# Patient Record
Sex: Male | Born: 1970 | Race: White | Hispanic: No | Marital: Single | Smoking: Current every day smoker
Health system: Southern US, Community
[De-identification: ages and names within clinical notes are randomized; demographics above are authoritative.]

---

## 2018-08-09 ENCOUNTER — Other Ambulatory Visit: Payer: Self-pay

## 2018-08-09 ENCOUNTER — Ambulatory Visit (INDEPENDENT_AMBULATORY_CARE_PROVIDER_SITE_OTHER): Payer: Self-pay

## 2018-08-09 ENCOUNTER — Encounter (HOSPITAL_COMMUNITY): Payer: Self-pay

## 2018-08-09 ENCOUNTER — Ambulatory Visit (HOSPITAL_COMMUNITY)
Admission: EM | Admit: 2018-08-09 | Discharge: 2018-08-09 | Disposition: A | Discharge status: Home / Self Care | Payer: Self-pay | Attending: Internal Medicine | Admitting: Internal Medicine

## 2018-08-09 DIAGNOSIS — K5900 Constipation, unspecified: Secondary | ICD-10-CM

## 2018-08-09 MED ORDER — POLYETHYLENE GLYCOL 3350 17 GM/SCOOP PO POWD: 1.0000 | Freq: Once | ORAL | 0 refills | Status: AC

## 2018-08-09 NOTE — ED Triage Notes (Signed)
Pt cc abdominal pain. This started Monday but is getting better.

## 2018-08-09 NOTE — ED Provider Notes (Signed)
[facility]    CSN: [id number] Arrival date & time: 08/09/18  1227     History   Chief Complaint Chief Complaint  Patient presents with  . Abdominal Pain    HPI Christian Hooper is a 48 y.o. male.   Patient is a 48 year old male that presents with abdominal pain, bloating that is been constant since Monday.  Most of his pain is in the left upper quadrant area.  He has been taking Pepto-Bismol for his symptoms which does not help.  He has had a few episodes of diarrhea.  This morning he had a hard stool and his abdominal pain was slightly decreased after the stool.  He denies any blood in his stool.  He denies any new medicines, fevers, chills, myalgias.  He denies any recent traveling or sick contacts.  He reports a history of anxiety and is wondering this is related.  He does not drink alcohol.  ROS per HPI      History reviewed. No pertinent past medical history.  There are no active problems to display for this patient.   History reviewed. No pertinent surgical history.     Home Medications    Prior to Admission medications   Medication Sig Start Date End Date Taking? Authorizing Provider  polyethylene glycol powder (GLYCOLAX/MIRALAX) powder Take 255 g by mouth once for 1 dose. 08/09/18 08/09/18  Janace Aris, NP    Family History History reviewed. No pertinent family history.  Social History Social History   Tobacco Use  . Smoking status: Current Every Day Smoker  . Smokeless tobacco: Never Used  Substance Use Topics  . Alcohol use: Never    Frequency: Never  . Drug use: Never     Allergies   Patient has no known allergies.   Review of Systems Review of Systems   Physical Exam Triage Vital Signs ED Triage Vitals  Enc Vitals Group     BP 08/09/18 1337 120/87     Pulse Rate 08/09/18 1337 75     Resp 08/09/18 1337 18     Temp 08/09/18 1337 98.8 F (37.1 C)     Temp Source 08/09/18 1337 Tympanic     SpO2 08/09/18 1337 100 %   Weight 08/09/18 1339 180 lb (81.6 kg)     Height --      Head Circumference --      Peak Flow --      Pain Score 08/09/18 1337 3     Pain Loc --      Pain Edu? --      Excl. in GC? --    No data found.  Updated Vital Signs BP 120/87 (BP Location: Right Arm)   Pulse 75   Temp 98.8 F (37.1 C) (Tympanic)   Resp 18   Wt 180 lb (81.6 kg)   SpO2 100%   Visual Acuity Right Eye Distance:   Left Eye Distance:   Bilateral Distance:    Right Eye Near:   Left Eye Near:    Bilateral Near:     Physical Exam Vitals signs and nursing note reviewed.  Constitutional:      Appearance: He is well-developed.  HENT:     Head: Normocephalic and atraumatic.  Eyes:     Conjunctiva/sclera: Conjunctivae normal.  Neck:     Musculoskeletal: Neck supple.  Cardiovascular:     Rate and Rhythm: Normal rate and regular rhythm.     Heart sounds: No murmur.  Pulmonary:  Effort: Pulmonary effort is normal. No respiratory distress.     Breath sounds: Normal breath sounds.  Abdominal:     General: Bowel sounds are increased. There is distension. There is no abdominal bruit.     Palpations: Abdomen is soft.     Tenderness: There is abdominal tenderness in the left upper quadrant. There is no right CVA tenderness, left CVA tenderness, guarding or rebound. Negative signs include Murphy's sign, Rovsing's sign and McBurney's sign.     Hernia: No hernia is present.  Skin:    General: Skin is warm and dry.  Neurological:     Mental Status: He is alert.  Psychiatric:        Mood and Affect: Mood normal.      UC Treatments / Results  Labs (all labs ordered are listed, but only abnormal results are displayed) Labs Reviewed - No data to display  EKG None  Radiology Dg Abd 2 Views  Result Date: 08/09/2018 CLINICAL DATA:  48 year old male with a history of abdominal pain EXAM: ABDOMEN - 2 VIEW COMPARISON:  None. FINDINGS: The bowel gas pattern is normal. There is no evidence of free air. No  significant stool burden. No radio-opaque calculi or other significant radiographic abnormality is seen. Incompletely imaged surgical changes of the right hip IMPRESSION: Negative plain film abdomen Electronically Signed   By: Gilmer Mor D.O.   On: 08/09/2018 15:25    Procedures Procedures (including critical care time)  Medications Ordered in UC Medications - No data to display  Initial Impression / Assessment and Plan / UC Course  I have reviewed the triage vital signs and the nursing notes.  Pertinent labs & imaging results that were available during my care of the patient were reviewed by me and considered in my medical decision making (see chart for details).     X-ray revealed moderate stool in the left and right upper quadrant area. Most likely this is causing his discomfort. We will have him do MiraLAX for constipation Instructed to have his symptoms do not improve or if they become worse with more severe abdominal pain, nausea, vomiting he will need to go to the ER. Patient understanding and agreeable to plan. Final Clinical Impressions(s) / UC Diagnoses   Final diagnoses:  Constipation, unspecified constipation type     Discharge Instructions     Your x-ray did reveal a significant amount of stool We will go ahead and treat this with MiraLAX You can take this twice a day until you get a good bowel movement Then you can back off to once a day or once every couple days to maintain regular bowel movements    ED Prescriptions    Medication Sig Dispense Auth. Provider   polyethylene glycol powder (GLYCOLAX/MIRALAX) powder Take 255 g by mouth once for 1 dose. 255 g Dahlia Byes A, NP     Controlled Substance Prescriptions [place] Controlled Substance Registry consulted? Not Applicable   Janace Aris, NP 08/09/18 1614

## 2018-08-09 NOTE — Discharge Instructions (Addendum)
Your x-ray did reveal a significant amount of stool We will go ahead and treat this with MiraLAX You can take this twice a day until you get a good bowel movement Then you can back off to once a day or once every couple days to maintain regular bowel movements

## 2019-10-10 ENCOUNTER — Ambulatory Visit: Payer: Self-pay | Attending: Internal Medicine

## 2019-10-10 DIAGNOSIS — Z23 Encounter for immunization: Secondary | ICD-10-CM

## 2019-10-10 NOTE — Progress Notes (Signed)
   Covid-19 Vaccination Clinic  Name:  Christian Hooper    MRN: [id number] DOB: July 29, 1970  10/10/2019  [name] was observed post Covid-19 immunization for 15 minutes without incident. He was provided with Vaccine Information Sheet and instruction to access the V-Safe system.   [name] was instructed to call 911 with any severe reactions post vaccine: Marland Kitchen Difficulty breathing  . Swelling of face and throat  . A fast heartbeat  . A bad rash all over body  . Dizziness and weakness   Immunizations Administered    Name Date Dose VIS Date Route   Pfizer COVID-19 Vaccine 10/10/2019  4:43 PM 0.3 mL 06/28/2019 Intramuscular   Manufacturer: [employer], [employer]   Lot: [id number]   NDC: [address]-3

## 2019-11-04 ENCOUNTER — Ambulatory Visit: Payer: Self-pay | Attending: Internal Medicine

## 2019-11-04 DIAGNOSIS — Z23 Encounter for immunization: Secondary | ICD-10-CM

## 2019-11-04 NOTE — Progress Notes (Signed)
   Covid-19 Vaccination Clinic  Name:  Christian Hooper    MRN: [id number] DOB: 03-May-1971  11/04/2019  Christian Hooper was observed post Covid-19 immunization for 15 minutes without incident. He was provided with Vaccine Information Sheet and instruction to access the V-Safe system.   Christian Hooper was instructed to call 911 with any severe reactions post vaccine: Marland Kitchen Difficulty breathing  . Swelling of face and throat  . A fast heartbeat  . A bad rash all over body  . Dizziness and weakness   Immunizations Administered    Name Date Dose VIS Date Route   Pfizer COVID-19 Vaccine 11/04/2019  1:57 PM 0.3 mL 09/11/2018 Intramuscular   Manufacturer: [employer], [employer]   Lot: [id number]   NDC: [address]-4

## 2020-07-08 IMAGING — DX DG ABDOMEN 2V
2 series · 2 of 2 positions shown · non-contrast
Comparison: None.

CLINICAL DATA: 47-year-old male with a history of abdominal pain

EXAM:
ABDOMEN - 2 VIEW

[abdomen erect]
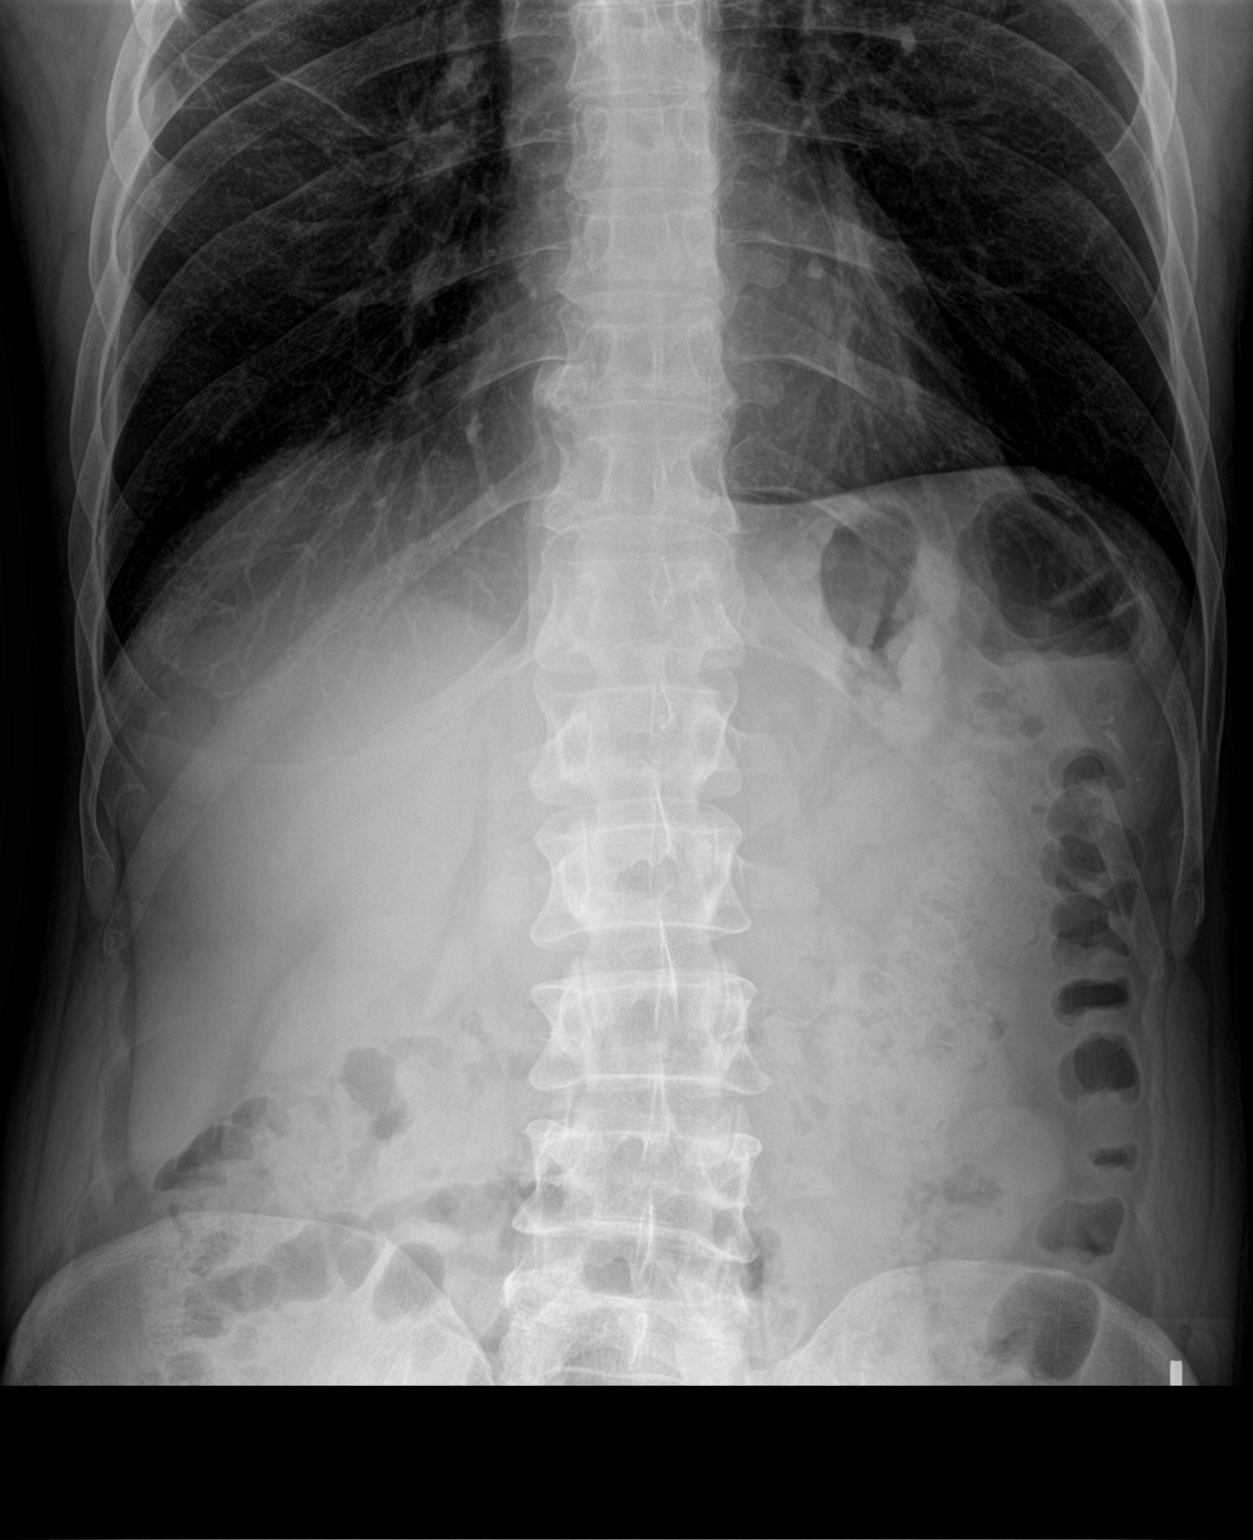

[abdomen supine]
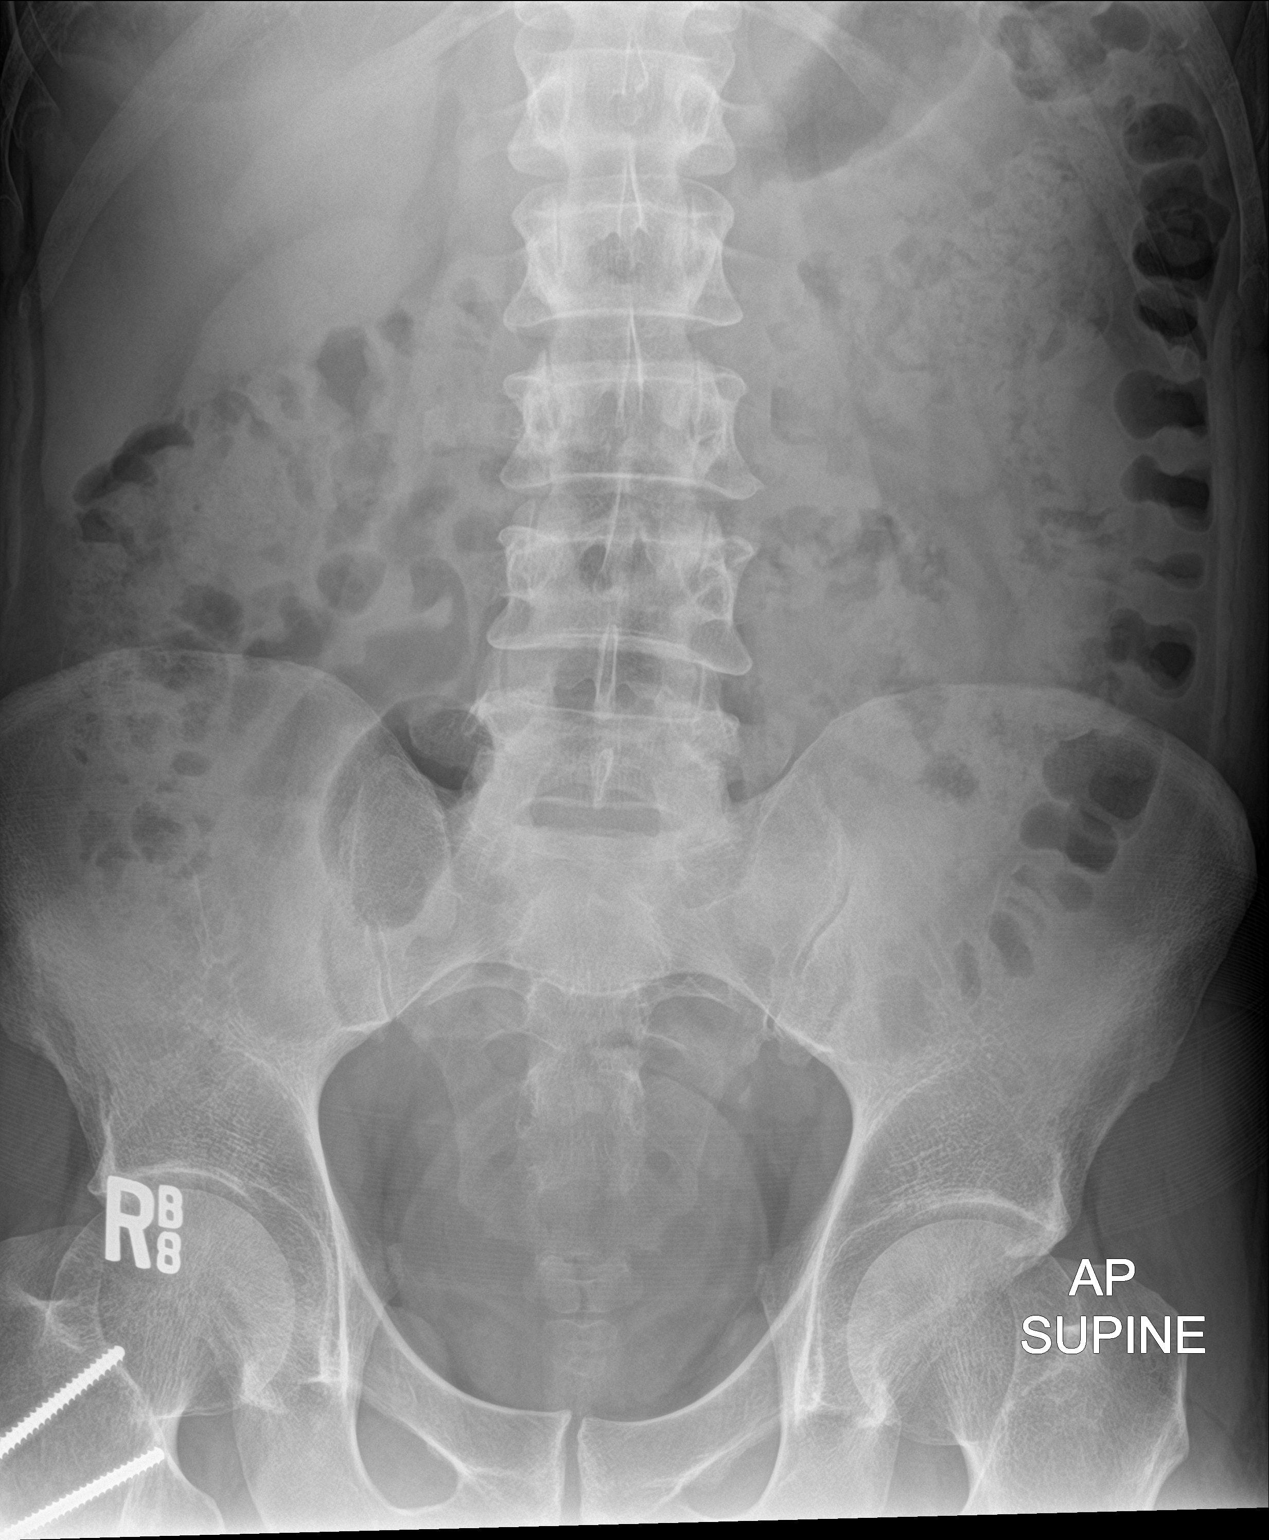

[2 of 2 positions shown; findings below may reference images not displayed]

FINDINGS: The bowel gas pattern is normal. There is no evidence of free air.
No significant stool burden. No radio-opaque calculi or other
significant radiographic abnormality is seen. Incompletely imaged
surgical changes of the right hip
IMPRESSION: Negative plain film abdomen
